# Patient Record
Sex: Male | Born: 1995 | Race: Black or African American | Hispanic: No | Marital: Single | Smoking: Current some day smoker
Health system: Southern US, Community
[De-identification: ages and names within clinical notes are randomized; demographics above are authoritative.]

## PROBLEM LIST (undated history)

## (undated) DIAGNOSIS — R011 Cardiac murmur, unspecified: Secondary | ICD-10-CM

## (undated) DIAGNOSIS — J45909 Unspecified asthma, uncomplicated: Secondary | ICD-10-CM

---
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
## 2015-06-06 IMAGING — CR DG CHEST 2V
2 series · 2 of 2 positions shown · non-contrast
Comparison: None.

CLINICAL DATA: Chest pain, congestion, cough

EXAM:
CHEST  2 VIEW

[w chest pa]
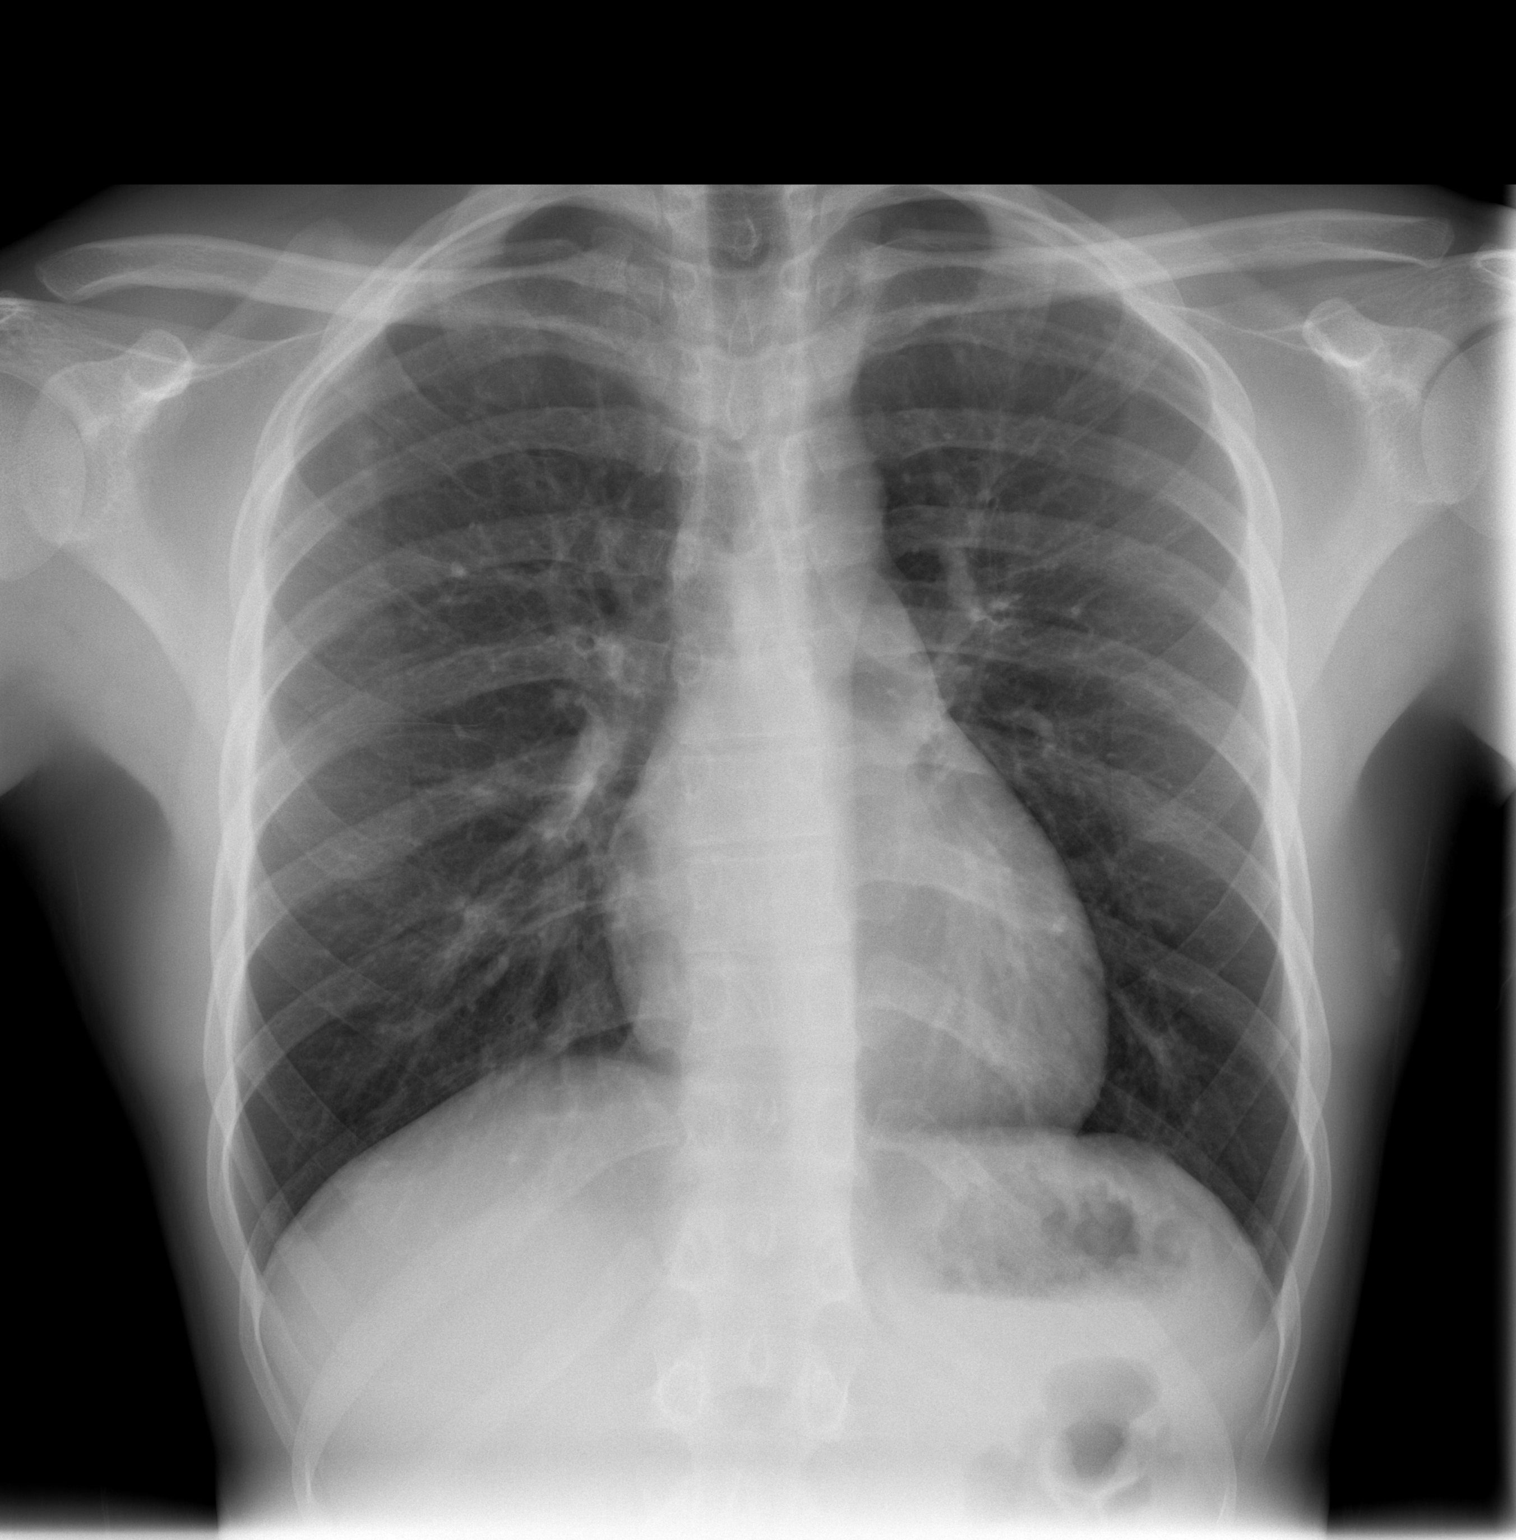

[w chest lat]
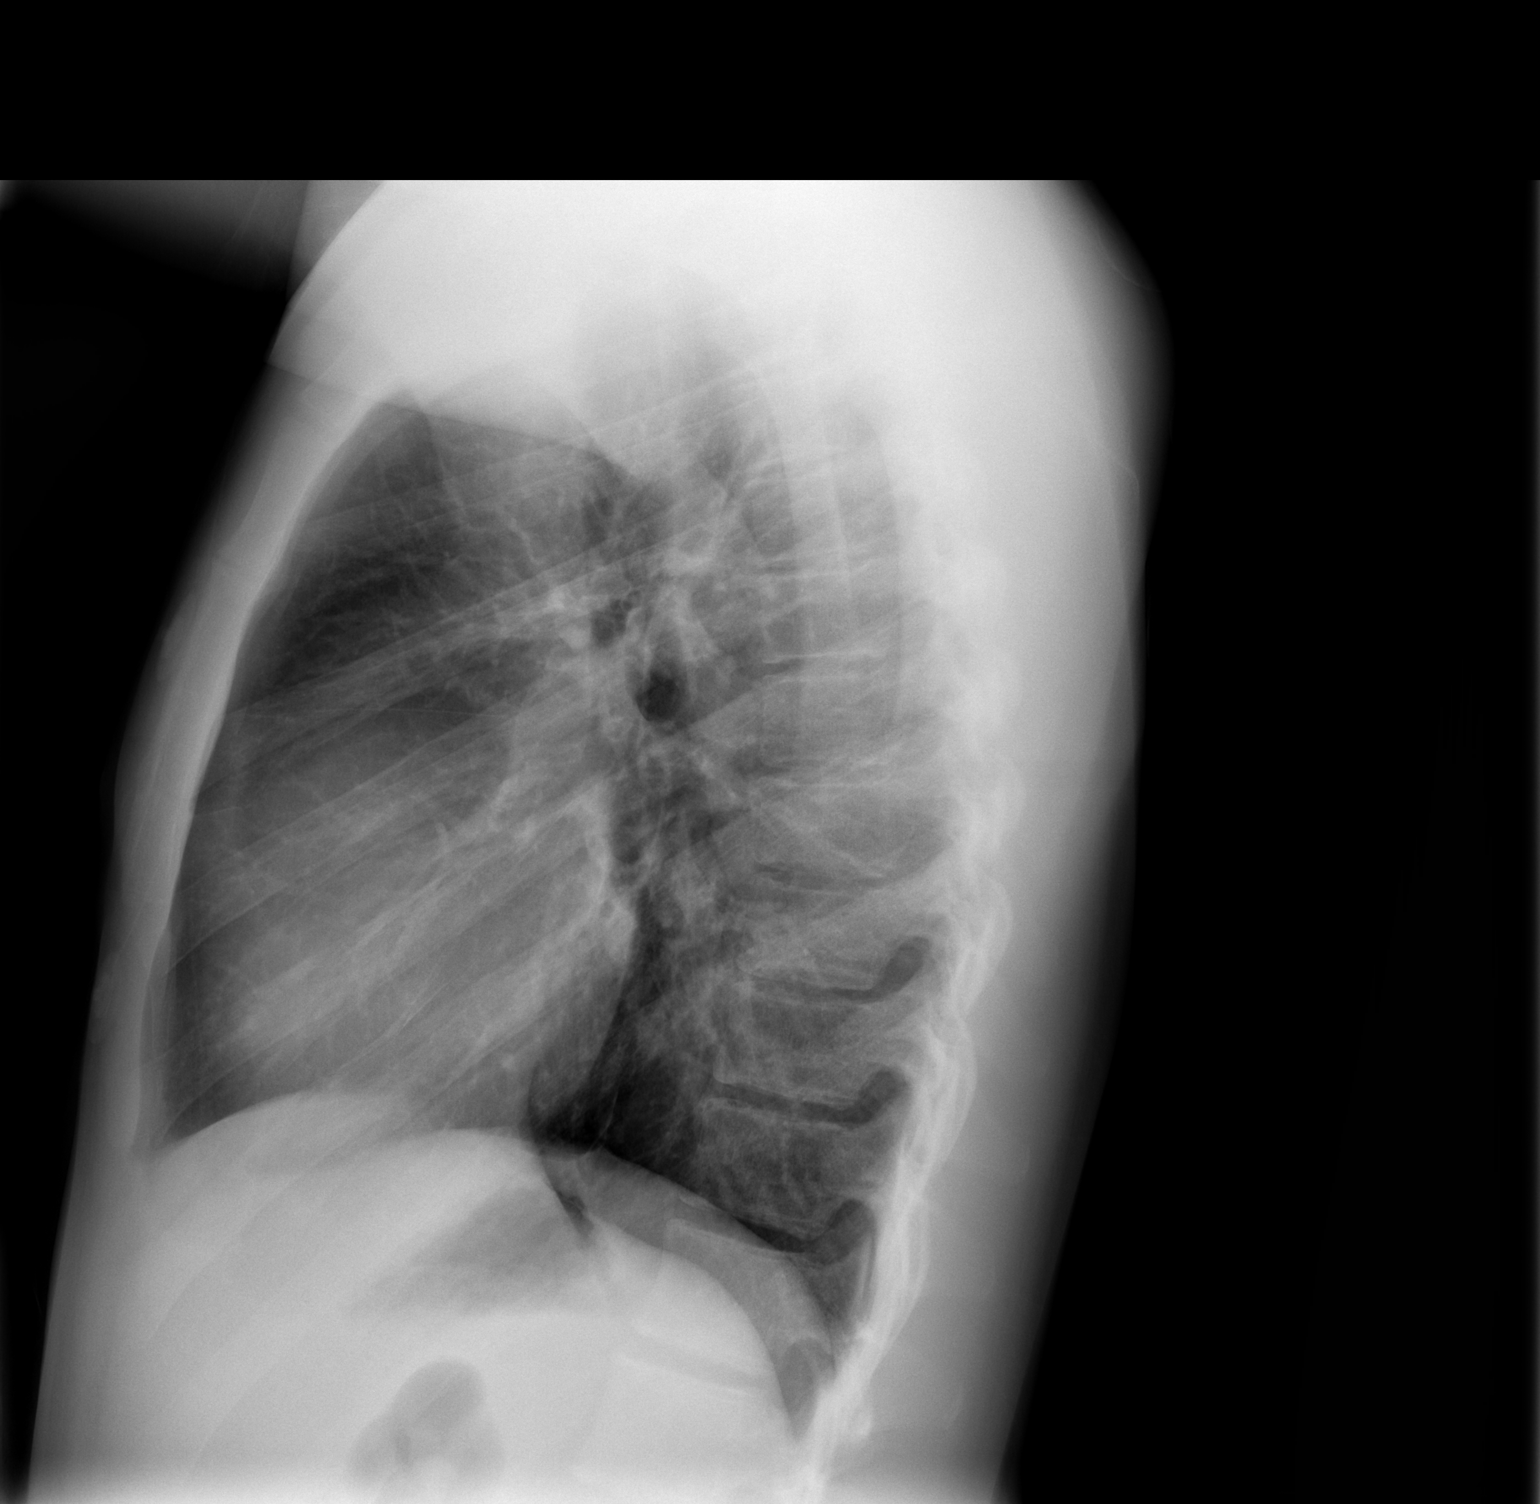

[2 of 2 positions shown; findings below may reference images not displayed]

FINDINGS: The heart size and mediastinal contours are within normal limits.
There is no focal infiltrate, pulmonary edema, or pleural effusion.
The visualized skeletal structures are unremarkable.
IMPRESSION: No active cardiopulmonary disease.
